# Patient Record
Sex: Female | Born: 1970 | Race: White | Hispanic: No | Marital: Married | Smoking: Current every day smoker
Health system: Southern US, Community
[De-identification: ages and names within clinical notes are randomized; demographics above are authoritative.]

## PROBLEM LIST (undated history)

## (undated) DIAGNOSIS — J302 Other seasonal allergic rhinitis: Secondary | ICD-10-CM

## (undated) DIAGNOSIS — E039 Hypothyroidism, unspecified: Secondary | ICD-10-CM

## (undated) DIAGNOSIS — K219 Gastro-esophageal reflux disease without esophagitis: Secondary | ICD-10-CM

## (undated) DIAGNOSIS — E119 Type 2 diabetes mellitus without complications: Secondary | ICD-10-CM

## (undated) DIAGNOSIS — N200 Calculus of kidney: Secondary | ICD-10-CM

## (undated) DIAGNOSIS — F988 Other specified behavioral and emotional disorders with onset usually occurring in childhood and adolescence: Secondary | ICD-10-CM

## (undated) HISTORY — PX: APPENDECTOMY: SHX54

## (undated) HISTORY — PX: OTHER SURGICAL HISTORY: SHX169

## (undated) HISTORY — PX: TUBAL LIGATION: SHX77

## (undated) HISTORY — PX: LITHOTRIPSY: SUR834

## (undated) HISTORY — PX: CHOLECYSTECTOMY: SHX55

---

## 2011-07-16 ENCOUNTER — Emergency Department (HOSPITAL_BASED_OUTPATIENT_CLINIC_OR_DEPARTMENT_OTHER)
Admission: EM | Admit: 2011-07-16 | Discharge: 2011-07-16 | Disposition: A | Discharge status: Home / Self Care | Payer: 59 | Attending: Emergency Medicine | Admitting: Emergency Medicine

## 2011-07-16 ENCOUNTER — Emergency Department (INDEPENDENT_AMBULATORY_CARE_PROVIDER_SITE_OTHER): Payer: 59

## 2011-07-16 ENCOUNTER — Encounter (HOSPITAL_BASED_OUTPATIENT_CLINIC_OR_DEPARTMENT_OTHER): Payer: Self-pay | Admitting: *Deleted

## 2011-07-16 DIAGNOSIS — F17200 Nicotine dependence, unspecified, uncomplicated: Secondary | ICD-10-CM | POA: Insufficient documentation

## 2011-07-16 DIAGNOSIS — R05 Cough: Secondary | ICD-10-CM

## 2011-07-16 DIAGNOSIS — R509 Fever, unspecified: Secondary | ICD-10-CM | POA: Insufficient documentation

## 2011-07-16 DIAGNOSIS — K219 Gastro-esophageal reflux disease without esophagitis: Secondary | ICD-10-CM | POA: Insufficient documentation

## 2011-07-16 DIAGNOSIS — J40 Bronchitis, not specified as acute or chronic: Secondary | ICD-10-CM | POA: Insufficient documentation

## 2011-07-16 HISTORY — DX: Other seasonal allergic rhinitis: J30.2

## 2011-07-16 HISTORY — DX: Gastro-esophageal reflux disease without esophagitis: K21.9

## 2011-07-16 HISTORY — DX: Calculus of kidney: N20.0

## 2011-07-16 MED ORDER — ALBUTEROL SULFATE HFA 108 (90 BASE) MCG/ACT IN AERS
2.0000 | INHALATION_SPRAY | Freq: Once | RESPIRATORY_TRACT | Status: AC
  Administered 2011-07-16: 2 via RESPIRATORY_TRACT
  Filled 2011-07-16: qty 6.7

## 2011-07-16 MED ORDER — AZITHROMYCIN 250 MG PO TABS: 250.0000 mg | ORAL_TABLET | Freq: Every day | ORAL | Status: AC

## 2011-07-16 NOTE — ED Provider Notes (Signed)
History     CSN: [id number]  Arrival date & time 07/16/11  1110   First MD Initiated Contact with Patient 07/16/11 1205      Chief Complaint  Patient presents with  . Cough  . Fever    (Consider location/radiation/quality/duration/timing/severity/associated sxs/prior treatment) Patient is a 41 y.o. female presenting with cough. The history is provided by the patient. No language interpreter was used.  Cough This is a new problem. The current episode started more than 1 week ago. The problem occurs constantly. The problem has been gradually worsening. The cough is productive of sputum. The maximum temperature recorded prior to her arrival was 100 to 100.9 F. The fever has been present for 1 to 2 days. Associated symptoms include sore throat. She has tried decongestants for the symptoms. The treatment provided no relief. She is a smoker. Her past medical history does not include pneumonia.  Pt reports she has had a cough for almost a month. Pt reports some shortness of breath.  Past Medical History  Diagnosis Date  . Kidney stones   . Seasonal allergies   . Acid reflux     Past Surgical History  Procedure Date  . Cholecystectomy   . Appendectomy   . Lithotripsy   . Tubal ligation     No family history on file.  History  Substance Use Topics  . Smoking status: Current Everyday Smoker -- 0.5 packs/day  . Smokeless tobacco: Never Used  . Alcohol Use: Yes     occasional    OB History    Grav Para Term Preterm Abortions TAB SAB Ect Mult Living                  Review of Systems  HENT: Positive for sore throat.   Respiratory: Positive for cough.   All other systems reviewed and are negative.    Allergies  Review of patient's allergies indicates no known allergies.  Home Medications   Current Outpatient Rx  Name Route Sig Dispense Refill  . OMEPRAZOLE 20 MG PO CPDR Oral Take 20 mg by mouth 2 (two) times daily.    Marland Kitchen ALKA-SELTZER PLUS COLD & COUGH PO Oral Take  by mouth.      BP 111/82  Pulse 88  Temp(Src) 98.4 F (36.9 C) (Oral)  Resp 20  Ht 5\' 3"  (1.6 m)  Wt 189 lb (85.73 kg)  BMI 33.48 kg/m2  SpO2 99%  LMP 07/02/2011  Physical Exam  Nursing note and vitals reviewed. Constitutional: She is oriented to person, place, and time. She appears well-developed and well-nourished.  HENT:  Head: Normocephalic and atraumatic.  Right Ear: External ear normal.  Left Ear: External ear normal.  Mouth/Throat: Oropharynx is clear and moist.  Eyes: Conjunctivae and EOM are normal. Pupils are equal, round, and reactive to light.  Neck: Normal range of motion. Neck supple.  Cardiovascular: Normal rate and normal heart sounds.   Pulmonary/Chest: Effort normal.  Abdominal: Soft.  Musculoskeletal: Normal range of motion.  Neurological: She is alert and oriented to person, place, and time. She has normal reflexes.  Skin: Skin is warm.  Psychiatric: She has a normal mood and affect.    ED Course  Procedures (including critical care time)  Labs Reviewed - No data to display No results found.   No diagnosis found.    MDM  No results found for this or any previous visit. Dg Chest 2 View  07/16/2011  *RADIOLOGY REPORT*  Clinical Data: Fever and flu-like  symptoms for several days  CHEST - 2 VIEW  Comparison: None.  Findings:  Normal cardiac silhouette and mediastinal contours.  No focal airspace opacities.  No pleural effusion or pneumothorax.  No acute osseous abnormalities.  Post cholecystectomy.  IMPRESSION: No acute cardiopulmonary disease.  Specifically, no evidence of pneumonia.  Original Report Authenticated By: Waynard Reeds, M.D.     Pt given albuterol and advised to use.  Pt given rx for zithromax      Langston Masker, [place] 07/16/11 1343

## 2011-07-16 NOTE — ED Provider Notes (Signed)
Medical screening examination/treatment/procedure(s) were performed by non-physician practitioner and as supervising physician I was immediately available for consultation/collaboration.   Gwyneth Sprout, MD 07/16/11 1540

## 2011-07-16 NOTE — ED Notes (Signed)
Reports cough and cold since christmas- has felt worse since Thursday- dry cough, fever, states cough productive last night- reports temp 103.9 last night- temp 98.4 in triage- pt states has not taken meds this morning

## 2015-08-19 ENCOUNTER — Emergency Department (HOSPITAL_BASED_OUTPATIENT_CLINIC_OR_DEPARTMENT_OTHER)
Admission: EM | Admit: 2015-08-19 | Discharge: 2015-08-19 | Disposition: A | Discharge status: Home / Self Care | Payer: Self-pay | Attending: Emergency Medicine | Admitting: Emergency Medicine

## 2015-08-19 ENCOUNTER — Other Ambulatory Visit: Payer: Self-pay

## 2015-08-19 ENCOUNTER — Emergency Department (HOSPITAL_BASED_OUTPATIENT_CLINIC_OR_DEPARTMENT_OTHER): Payer: Self-pay

## 2015-08-19 ENCOUNTER — Encounter (HOSPITAL_BASED_OUTPATIENT_CLINIC_OR_DEPARTMENT_OTHER): Payer: Self-pay | Admitting: Emergency Medicine

## 2015-08-19 DIAGNOSIS — R110 Nausea: Secondary | ICD-10-CM | POA: Insufficient documentation

## 2015-08-19 DIAGNOSIS — Z87442 Personal history of urinary calculi: Secondary | ICD-10-CM | POA: Insufficient documentation

## 2015-08-19 DIAGNOSIS — Z8709 Personal history of other diseases of the respiratory system: Secondary | ICD-10-CM | POA: Insufficient documentation

## 2015-08-19 DIAGNOSIS — Z7984 Long term (current) use of oral hypoglycemic drugs: Secondary | ICD-10-CM | POA: Insufficient documentation

## 2015-08-19 DIAGNOSIS — Z8659 Personal history of other mental and behavioral disorders: Secondary | ICD-10-CM | POA: Insufficient documentation

## 2015-08-19 DIAGNOSIS — E119 Type 2 diabetes mellitus without complications: Secondary | ICD-10-CM | POA: Insufficient documentation

## 2015-08-19 DIAGNOSIS — R0789 Other chest pain: Secondary | ICD-10-CM

## 2015-08-19 DIAGNOSIS — F17200 Nicotine dependence, unspecified, uncomplicated: Secondary | ICD-10-CM | POA: Insufficient documentation

## 2015-08-19 DIAGNOSIS — E039 Hypothyroidism, unspecified: Secondary | ICD-10-CM | POA: Insufficient documentation

## 2015-08-19 DIAGNOSIS — R42 Dizziness and giddiness: Secondary | ICD-10-CM | POA: Insufficient documentation

## 2015-08-19 DIAGNOSIS — Z79899 Other long term (current) drug therapy: Secondary | ICD-10-CM | POA: Insufficient documentation

## 2015-08-19 DIAGNOSIS — K219 Gastro-esophageal reflux disease without esophagitis: Secondary | ICD-10-CM | POA: Insufficient documentation

## 2015-08-19 DIAGNOSIS — R0602 Shortness of breath: Secondary | ICD-10-CM | POA: Insufficient documentation

## 2015-08-19 HISTORY — DX: Other specified behavioral and emotional disorders with onset usually occurring in childhood and adolescence: F98.8

## 2015-08-19 HISTORY — DX: Type 2 diabetes mellitus without complications: E11.9

## 2015-08-19 HISTORY — DX: Hypothyroidism, unspecified: E03.9

## 2015-08-19 LAB — CBC WITH DIFFERENTIAL/PLATELET
Basophils Absolute: 0.1 10*3/uL (ref 0.0–0.1)
Basophils Relative: 1 %
EOS PCT: 1 %
Eosinophils Absolute: 0.1 10*3/uL (ref 0.0–0.7)
HEMATOCRIT: 39.6 % (ref 36.0–46.0)
HEMOGLOBIN: 14.2 g/dL (ref 12.0–15.0)
LYMPHS ABS: 2.6 10*3/uL (ref 0.7–4.0)
LYMPHS PCT: 25 %
MCH: 30.3 pg (ref 26.0–34.0)
MCHC: 35.9 g/dL (ref 30.0–36.0)
MCV: 84.6 fL (ref 78.0–100.0)
Monocytes Absolute: 0.8 10*3/uL (ref 0.1–1.0)
Monocytes Relative: 8 %
NEUTROS ABS: 6.9 10*3/uL (ref 1.7–7.7)
NEUTROS PCT: 65 %
Platelets: 327 10*3/uL (ref 150–400)
RBC: 4.68 MIL/uL (ref 3.87–5.11)
RDW: 13 % (ref 11.5–15.5)
WBC: 10.4 10*3/uL (ref 4.0–10.5)

## 2015-08-19 LAB — BASIC METABOLIC PANEL
Anion gap: 10 (ref 5–15)
BUN: 9 mg/dL (ref 6–20)
CHLORIDE: 107 mmol/L (ref 101–111)
CO2: 19 mmol/L — AB (ref 22–32)
CREATININE: 0.77 mg/dL (ref 0.44–1.00)
Calcium: 8.7 mg/dL — ABNORMAL LOW (ref 8.9–10.3)
GFR calc non Af Amer: >60 mL/min (ref >60)
Glucose, Bld: 134 mg/dL — ABNORMAL HIGH (ref 65–99)
Potassium: 3.6 mmol/L (ref 3.5–5.1)
Sodium: 136 mmol/L (ref 135–145)

## 2015-08-19 LAB — TROPONIN I
Troponin I: <0.03 ng/mL (ref <0.031)
Troponin I: <0.03 ng/mL (ref <0.031)

## 2015-08-19 MED ORDER — IBUPROFEN 800 MG PO TABS
800.0000 mg | ORAL_TABLET | Freq: Once | ORAL | Status: AC
  Administered 2015-08-19: 800 mg via ORAL
  Filled 2015-08-19: qty 1

## 2015-08-19 MED ORDER — ASPIRIN EC 325 MG PO TBEC
325.0000 mg | DELAYED_RELEASE_TABLET | Freq: Once | ORAL | Status: AC
  Administered 2015-08-19: 325 mg via ORAL
  Filled 2015-08-19: qty 1

## 2015-08-19 NOTE — ED Notes (Signed)
Patient transported to X-ray 

## 2015-08-19 NOTE — ED Notes (Signed)
Patient resting in room, denies any needs at this time. Call bell within reach, bed in lowest position.

## 2015-08-19 NOTE — ED Provider Notes (Signed)
CSN: [id number]     Arrival date & time 08/19/15  1250 History   First MD Initiated Contact with Patient 08/19/15 1336     Chief Complaint  Patient presents with  . Chest Pain     (Consider location/radiation/quality/duration/timing/severity/associated sxs/prior Treatment) Patient is a 45 y.o. female presenting with chest pain. The history is provided by the patient.  Chest Pain Pain location:  R chest Pain quality: sharp   Pain radiates to:  Does not radiate Pain radiates to the back: no   Pain severity:  Severe Onset quality:  Sudden Timing:  Sporadic Progression:  Unchanged Chronicity:  New Context: at rest   Context: not breathing   Relieved by:  Nothing Worsened by:  Nothing tried Ineffective treatments: tylenol. Associated symptoms: dizziness (when pain occurs), nausea (when pain occurs) and shortness of breath (when pain occurs)   Associated symptoms: no abdominal pain, no back pain, no cough, no fever, no lower extremity edema, no near-syncope, no palpitations, no syncope and not vomiting   Risk factors: diabetes mellitus and smoking   Risk factors: no immobilization, no prior DVT/PE and no surgery   Maria Cardenas is a 45 y.o. female with PMH significant for DM, hypothyroid, ADD, GERD who presents with 2 day history of sporadic, increasing in frequency, severe right sided chest pain.  Patient reports 30+ episodes today of sharp, non-radiating right sided chest pain that lasts approximately 30-45 seconds then resolves.  During the episodes she experiencing SOB, nausea, and dizziness.  No exertional or inspirational exacerbation.  Denies fever, chills, palpitations, syncope, presyncope, or unilateral leg swelling.  Denies recent illness. No alleviating factors.  She has tried tylenol with no relief.  No family hx of SCD.  No hx of DVT/PE, hemoptysis, recent travel/immobizilation/surgery.  She smokes a little less than 0.5 ppd.     Past Medical History  Diagnosis Date  .  Kidney stones   . Seasonal allergies   . Acid reflux   . Diabetes mellitus without complication (HCC)   . Hypothyroid   . ADD (attention deficit disorder)    Past Surgical History  Procedure Laterality Date  . Cholecystectomy    . Appendectomy    . Lithotripsy    . Tubal ligation    . Bladder tack     No family history on file. Social History  Substance Use Topics  . Smoking status: Current Every Day Smoker -- 0.50 packs/day  . Smokeless tobacco: Never Used  . Alcohol Use: Yes     Comment: occasional   OB History    No data available     Review of Systems  Constitutional: Negative for fever and chills.  Respiratory: Positive for shortness of breath (when pain occurs). Negative for cough.   Cardiovascular: Positive for chest pain. Negative for palpitations, leg swelling, syncope and near-syncope.  Gastrointestinal: Positive for nausea (when pain occurs). Negative for vomiting and abdominal pain.  Musculoskeletal: Negative for back pain.  Skin: Negative for color change.  Neurological: Positive for dizziness (when pain occurs). Negative for syncope.  All other systems reviewed and are negative.     Allergies  Review of patient's allergies indicates no known allergies.  Home Medications   Prior to Admission medications   Medication Sig Start Date End Date Taking? Authorizing Provider  levothyroxine (SYNTHROID, LEVOTHROID) 50 MCG tablet Take 50 mcg by mouth daily before breakfast.   Yes Historical Provider, MD  metFORMIN (GLUCOPHAGE) 1000 MG tablet Take 1,000 mg by mouth 2 (two) times  daily with a meal.   Yes Historical Provider, MD  traZODone (DESYREL) 150 MG tablet Take 150 mg by mouth at bedtime as needed for sleep.   Yes Historical Provider, MD  omeprazole (PRILOSEC) 20 MG capsule Take 20 mg by mouth 2 (two) times daily.    Historical Provider, MD   BP 131/100 mmHg  Pulse 92  Temp(Src) 98 F (36.7 C) (Oral)  Resp 16  SpO2 100%  LMP 07/29/2015 Physical Exam   Constitutional: She is oriented to person, place, and time. She appears well-developed and well-nourished.  Non-toxic appearance. She does not have a sickly appearance. She does not appear ill.  HENT:  Head: Normocephalic and atraumatic.  Mouth/Throat: Oropharynx is clear and moist.  Eyes: Conjunctivae are normal. Pupils are equal, round, and reactive to light.  Neck: Normal range of motion. Neck supple.  Cardiovascular: Normal rate, regular rhythm and normal heart sounds.   No murmur heard. No lower extremity edema.   Pulmonary/Chest: Effort normal and breath sounds normal. No accessory muscle usage or stridor. No respiratory distress. She has no wheezes. She has no rhonchi. She has no rales.  Abdominal: Soft. Bowel sounds are normal. She exhibits no distension. There is no tenderness.  Musculoskeletal: Normal range of motion.  Lymphadenopathy:    She has no cervical adenopathy.  Neurological: She is alert and oriented to person, place, and time.  Speech clear without dysarthria.  Skin: Skin is warm and dry.  Psychiatric: She has a normal mood and affect. Her behavior is normal.    ED Course  Procedures (including critical care time) Labs Review Labs Reviewed  BASIC METABOLIC PANEL - Abnormal; Notable for the following:    CO2 19 (*)    Glucose, Bld 134 (*)    Calcium 8.7 (*)    All other components within normal limits  TROPONIN I  CBC WITH DIFFERENTIAL/PLATELET    Imaging Review Dg Chest 2 View  08/19/2015  CLINICAL DATA:  Right-sided chest pain for 2 days EXAM: CHEST  2 VIEW COMPARISON:  07/16/2011 FINDINGS: The heart size and mediastinal contours are within normal limits. Both lungs are clear. The visualized skeletal structures are unremarkable. IMPRESSION: No active cardiopulmonary disease. Electronically Signed   By: Alcide Clever M.D.   On: 08/19/2015 14:10   I have personally reviewed and evaluated these images and lab results as part of my medical decision-making.    EKG Interpretation   Date/Time:  Thursday August 19 2015 12:59:38 EST Ventricular Rate:  91 PR Interval:  122 QRS Duration: 74 QT Interval:  346 QTC Calculation: 425 R Axis:   78 Text Interpretation:  Normal sinus rhythm with sinus arrhythmia Normal ECG  No ischemic changes. No prior EKG Confirmed by LIU MD, DANA [phone]) on  08/19/2015 2:58:58 PM      MDM   Final diagnoses:  Atypical chest pain   Patient presents with CP.  VSS, NAD.  Patient given 324 ASA in ED. EKG without acute abnormalities.  Labs without acute abnromalities.  PERC negative, doubt PE.  Doubt ACS.  HEART score 2.  It does not sound pleuritic in nature.  Patient given 800 mg ibuprofen.  Plan to obtain delta troponin and repeat EKG.  Follow up PCP.  Return precautions discussed and all questions answered.   Patient care hand off to oncoming mid level, Rob Wallace, PA-C, at shift change who will follow up on delta troponin and repeat EKG.     Cheri Fowler, PA-C 08/19/15 [phone]  Lavera Guise, MD 08/19/15 [phone]

## 2015-08-19 NOTE — ED Provider Notes (Signed)
Patient signed out to me by Ellard Artis.  Plan:  Discharge after delta trop and EKG.  Delta troponin and EKG are negative.  DC to home.  Roxy Horseman, PA-C 08/19/15 1820  Vanetta Mulders, MD 08/20/15 Paulo Fruit

## 2015-08-19 NOTE — Discharge Instructions (Signed)
Nonspecific Chest Pain  °Chest pain can be caused by many different conditions. There is always a chance that your pain could be related to something serious, such as a heart attack or a blood clot in your lungs. Chest pain can also be caused by conditions that are not life-threatening. If you have chest pain, it is very important to follow up with your health care provider. °CAUSES  °Chest pain can be caused by: °· Heartburn. °· Pneumonia or bronchitis. °· Anxiety or stress. °· Inflammation around your heart (pericarditis) or lung (pleuritis or pleurisy). °· A blood clot in your lung. °· A collapsed lung (pneumothorax). It can develop suddenly on its own (spontaneous pneumothorax) or from trauma to the chest. °· Shingles infection (varicella-zoster virus). °· Heart attack. °· Damage to the bones, muscles, and cartilage that make up your chest wall. This can include: °¨ Bruised bones due to injury. °¨ Strained muscles or cartilage due to frequent or repeated coughing or overwork. °¨ Fracture to one or more ribs. °¨ Sore cartilage due to inflammation (costochondritis). °RISK FACTORS  °Risk factors for chest pain may include: °· Activities that increase your risk for trauma or injury to your chest. °· Respiratory infections or conditions that cause frequent coughing. °· Medical conditions or overeating that can cause heartburn. °· Heart disease or family history of heart disease. °· Conditions or health behaviors that increase your risk of developing a blood clot. °· Having had chicken pox (varicella zoster). °SIGNS AND SYMPTOMS °Chest pain can feel like: °· Burning or tingling on the surface of your chest or deep in your chest. °· Crushing, pressure, aching, or squeezing pain. °· Dull or sharp pain that is worse when you move, cough, or take a deep breath. °· Pain that is also felt in your back, neck, shoulder, or arm, or pain that spreads to any of these areas. °Your chest pain may come and go, or it may stay  constant. °DIAGNOSIS °Lab tests or other studies may be needed to find the cause of your pain. Your health care provider may have you take a test called an ambulatory ECG (electrocardiogram). An ECG records your heartbeat patterns at the time the test is performed. You may also have other tests, such as: °· Transthoracic echocardiogram (TTE). During echocardiography, sound waves are used to create a picture of all of the heart structures and to look at how blood flows through your heart. °· Transesophageal echocardiogram (TEE). This is a more advanced imaging test that obtains images from inside your body. It allows your health care provider to see your heart in finer detail. °· Cardiac monitoring. This allows your health care provider to monitor your heart rate and rhythm in real time. °· Holter monitor. This is a portable device that records your heartbeat and can help to diagnose abnormal heartbeats. It allows your health care provider to track your heart activity for several days, if needed. °· Stress tests. These can be done through exercise or by taking medicine that makes your heart beat more quickly. °· Blood tests. °· Imaging tests. °TREATMENT  °Your treatment depends on what is causing your chest pain. Treatment may include: °· Medicines. These may include: °¨ Acid blockers for heartburn. °¨ Anti-inflammatory medicine. °¨ Pain medicine for inflammatory conditions. °¨ Antibiotic medicine, if an infection is present. °¨ Medicines to dissolve blood clots. °¨ Medicines to treat coronary artery disease. °· Supportive care for conditions that do not require medicines. This may include: °¨ Resting. °¨ Applying heat   or cold packs to injured areas. °¨ Limiting activities until pain decreases. °HOME CARE INSTRUCTIONS °· If you were prescribed an antibiotic medicine, finish it all even if you start to feel better. °· Avoid any activities that bring on chest pain. °· Do not use any tobacco products, including  cigarettes, chewing tobacco, or electronic cigarettes. If you need help quitting, ask your health care provider. °· Do not drink alcohol. °· Take medicines only as directed by your health care provider. °· Keep all follow-up visits as directed by your health care provider. This is important. This includes any further testing if your chest pain does not go away. °· If heartburn is the cause for your chest pain, you may be told to keep your head raised (elevated) while sleeping. This reduces the chance that acid will go from your stomach into your esophagus. °· Make lifestyle changes as directed by your health care provider. These may include: °¨ Getting regular exercise. Ask your health care provider to suggest some activities that are safe for you. °¨ Eating a heart-healthy diet. A registered dietitian can help you to learn healthy eating options. °¨ Maintaining a healthy weight. °¨ Managing diabetes, if necessary. °¨ Reducing stress. °SEEK MEDICAL CARE IF: °· Your chest pain does not go away after treatment. °· You have a rash with blisters on your chest. °· You have a fever. °SEEK IMMEDIATE MEDICAL CARE IF:  °· Your chest pain is worse. °· You have an increasing cough, or you cough up blood. °· You have severe abdominal pain. °· You have severe weakness. °· You faint. °· You have chills. °· You have sudden, unexplained chest discomfort. °· You have sudden, unexplained discomfort in your arms, back, neck, or jaw. °· You have shortness of breath at any time. °· You suddenly start to sweat, or your skin gets clammy. °· You feel nauseous or you vomit. °· You suddenly feel light-headed or dizzy. °· Your heart begins to beat quickly, or it feels like it is skipping beats. °These symptoms may represent a serious problem that is an emergency. Do not wait to see if the symptoms will go away. Get medical help right away. Call your local emergency services (911 in the [place] Do not drive yourself to the hospital. °  °This  information is not intended to replace advice given to you by your health care provider. Make sure you discuss any questions you have with your health care provider. °  °Document Released: 03/15/2005 Document Revised: 06/26/2014 Document Reviewed: 01/09/2014 °Elsevier Interactive Patient Education ©2016 [employer] ° °

## 2015-08-19 NOTE — ED Notes (Signed)
Right sided chest pain, intermittent for two days.  Some dizziness, some nausea, some sob.  Pt states pain is sharp.  No recent travel.  Pt admits to leg cramps.

## 2017-01-20 IMAGING — DX DG CHEST 2V
2 series · 2 of 2 positions shown · non-contrast
Comparison: 07/16/2011

CLINICAL DATA: Right-sided chest pain for 2 days

EXAM:
CHEST  2 VIEW

[chest pa]
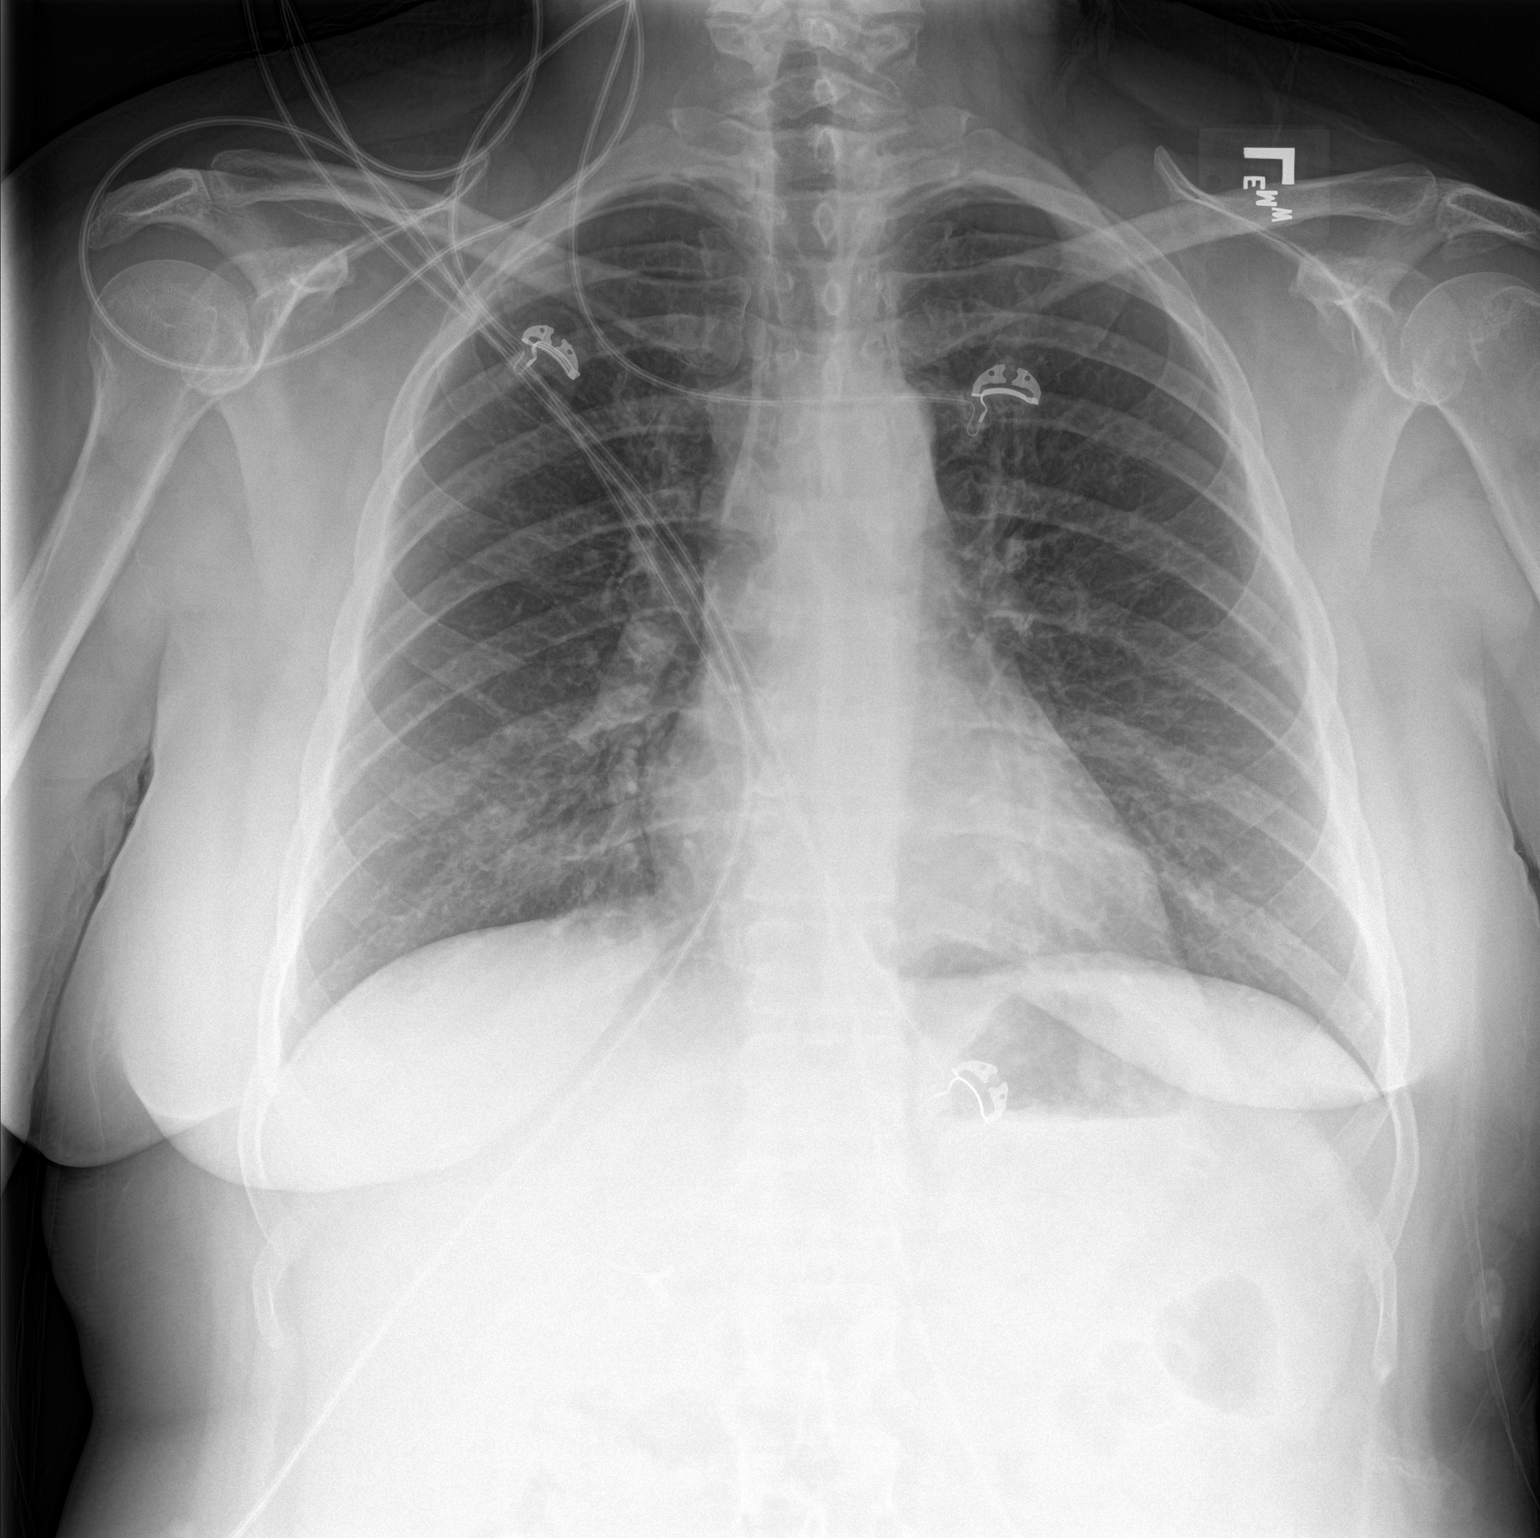

[chest lat]
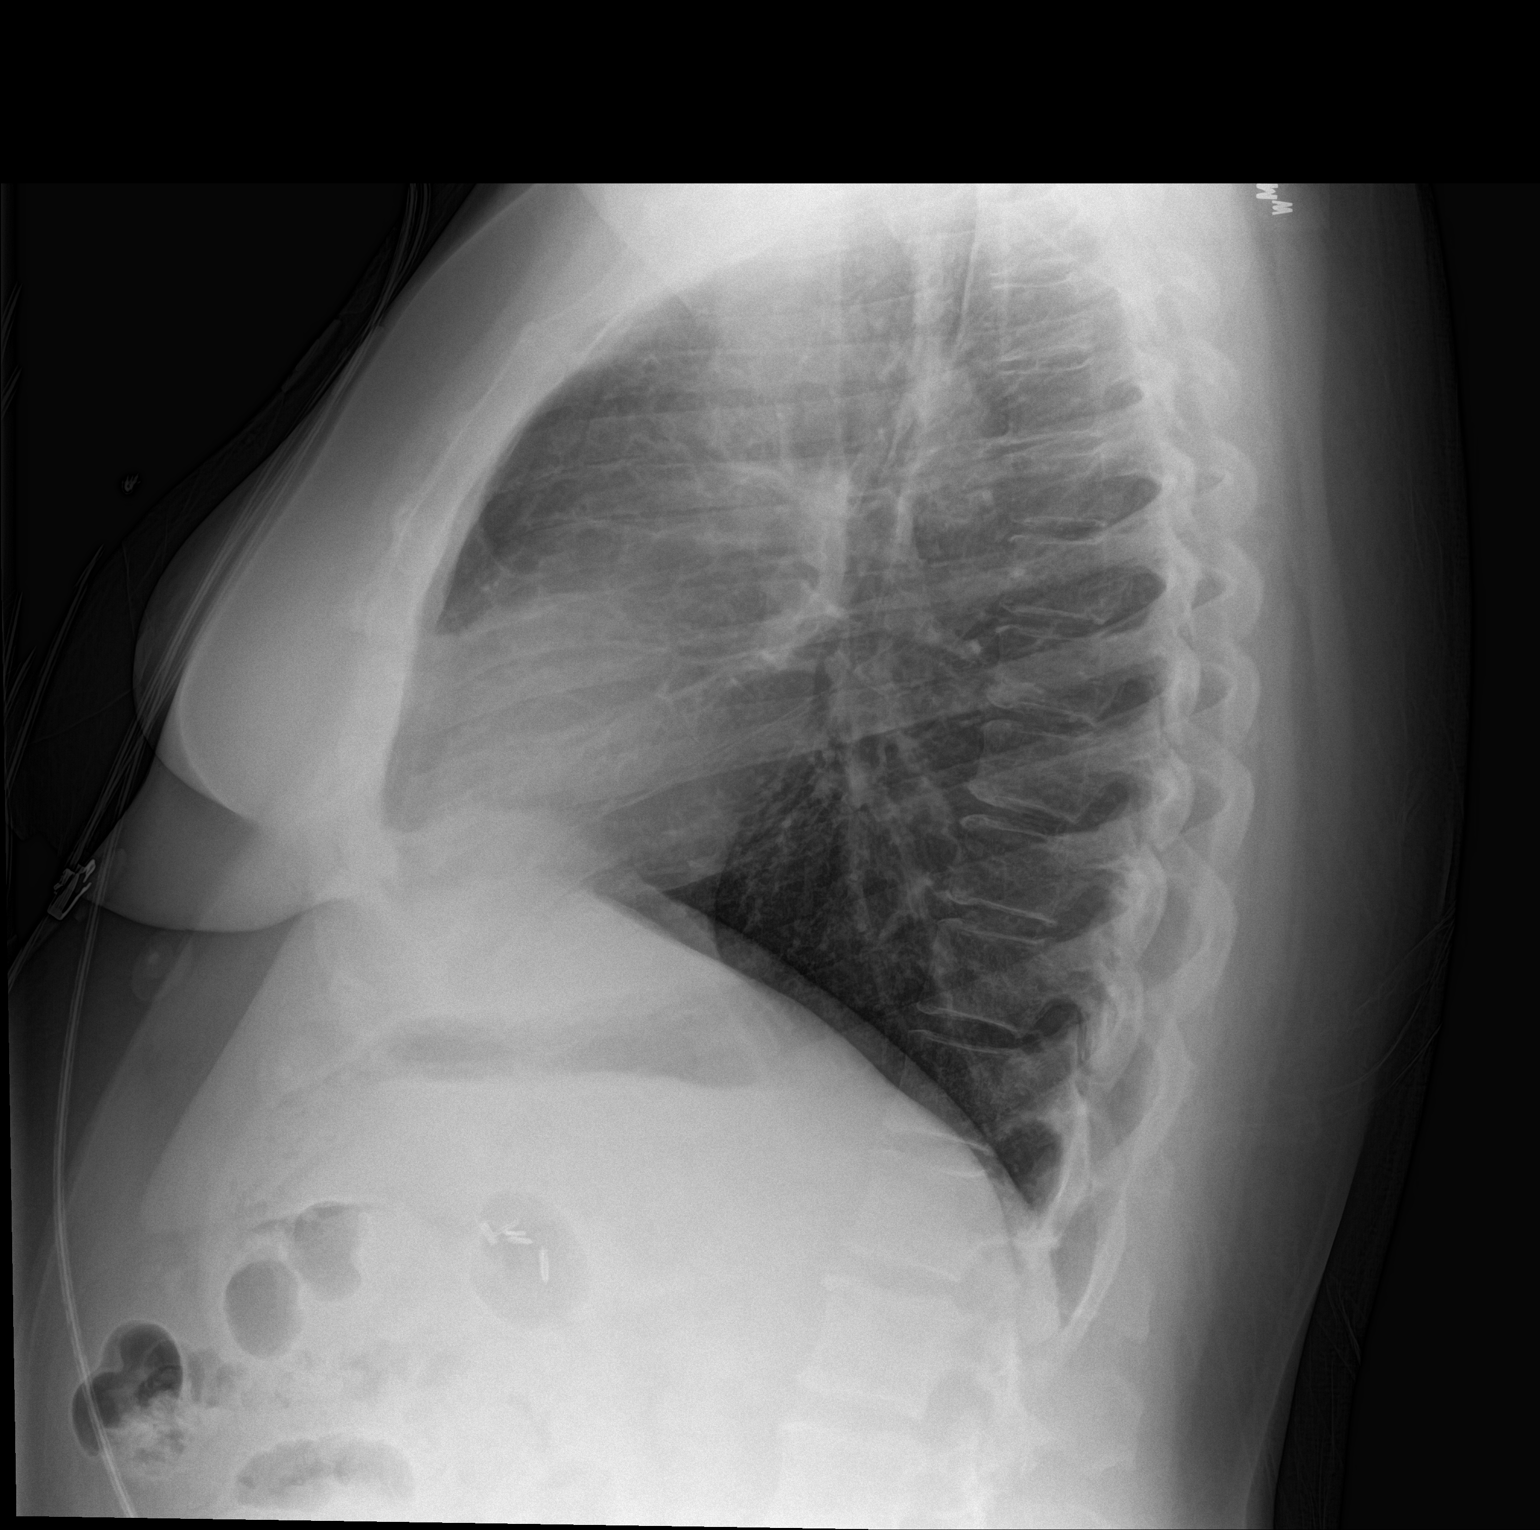

[2 of 2 positions shown; findings below may reference images not displayed]

FINDINGS: The heart size and mediastinal contours are within normal limits.
Both lungs are clear. The visualized skeletal structures are
unremarkable.
IMPRESSION: No active cardiopulmonary disease.
